# Patient Record
Sex: Female | Born: 2005 | Race: Black or African American | Hispanic: No | Marital: Single | Smoking: Never smoker
Health system: Southern US, Community
[De-identification: ages and names within clinical notes are randomized; demographics above are authoritative.]

## PROBLEM LIST (undated history)

## (undated) DIAGNOSIS — D573 Sickle-cell trait: Secondary | ICD-10-CM

## (undated) DIAGNOSIS — J302 Other seasonal allergic rhinitis: Secondary | ICD-10-CM

## (undated) DIAGNOSIS — F909 Attention-deficit hyperactivity disorder, unspecified type: Secondary | ICD-10-CM

## (undated) HISTORY — PX: HERNIA REPAIR: SHX51

---
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
## 2023-02-12 ENCOUNTER — Telehealth (INDEPENDENT_AMBULATORY_CARE_PROVIDER_SITE_OTHER): Payer: Self-pay | Admitting: Neurology

## 2023-02-12 NOTE — Telephone Encounter (Signed)
ERROR

## 2023-02-12 NOTE — Telephone Encounter (Signed)
  Name of who is calling: Miranda  Caller's Relationship to Patient: Mom  Best contact number: [phone]  Provider they see: Dr.Nab  Reason for call: Mom called to see if provider could write a note stating Marielis is no longer having seizures and has been cleared. Mom is requesting a callback.     PRESCRIPTION REFILL ONLY  Name of prescription:  Pharmacy:

## 2023-02-12 NOTE — Telephone Encounter (Signed)
Last OV was 02/01/21 RN cannot write a letter since she has not been seen in over 1 yr. Claudia White can come to the office and sign a release of records for the last office note which does mention her not having seizures.   Advised as above- they have moved to [place] and do not have access to a fax so they will complete medical release and mychart form and email it to [email or web address]
